# Patient Record
Sex: Male | Born: 1990 | Race: Black or African American | Hispanic: No | Marital: Single | Smoking: Never smoker
Health system: Southern US, Community
[De-identification: ages and names within clinical notes are randomized; demographics above are authoritative.]

## PROBLEM LIST (undated history)

## (undated) DIAGNOSIS — J45909 Unspecified asthma, uncomplicated: Secondary | ICD-10-CM

## (undated) HISTORY — DX: Unspecified asthma, uncomplicated: J45.909

---
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
## 2009-04-04 ENCOUNTER — Emergency Department (HOSPITAL_COMMUNITY): Admission: EM | Admit: 2009-04-04 | Discharge: 2009-04-05 | Payer: Self-pay | Admitting: Emergency Medicine

## 2010-03-10 IMAGING — CR DG CHEST 2V
2 series · 2 of 2 positions shown · non-contrast
Comparison: Chest radiograph performed 06/02/2007

CLINICAL DATA: Fever, cough and shortness of breath for 2 days.

CHEST - 2 VIEW

[w chest pa]
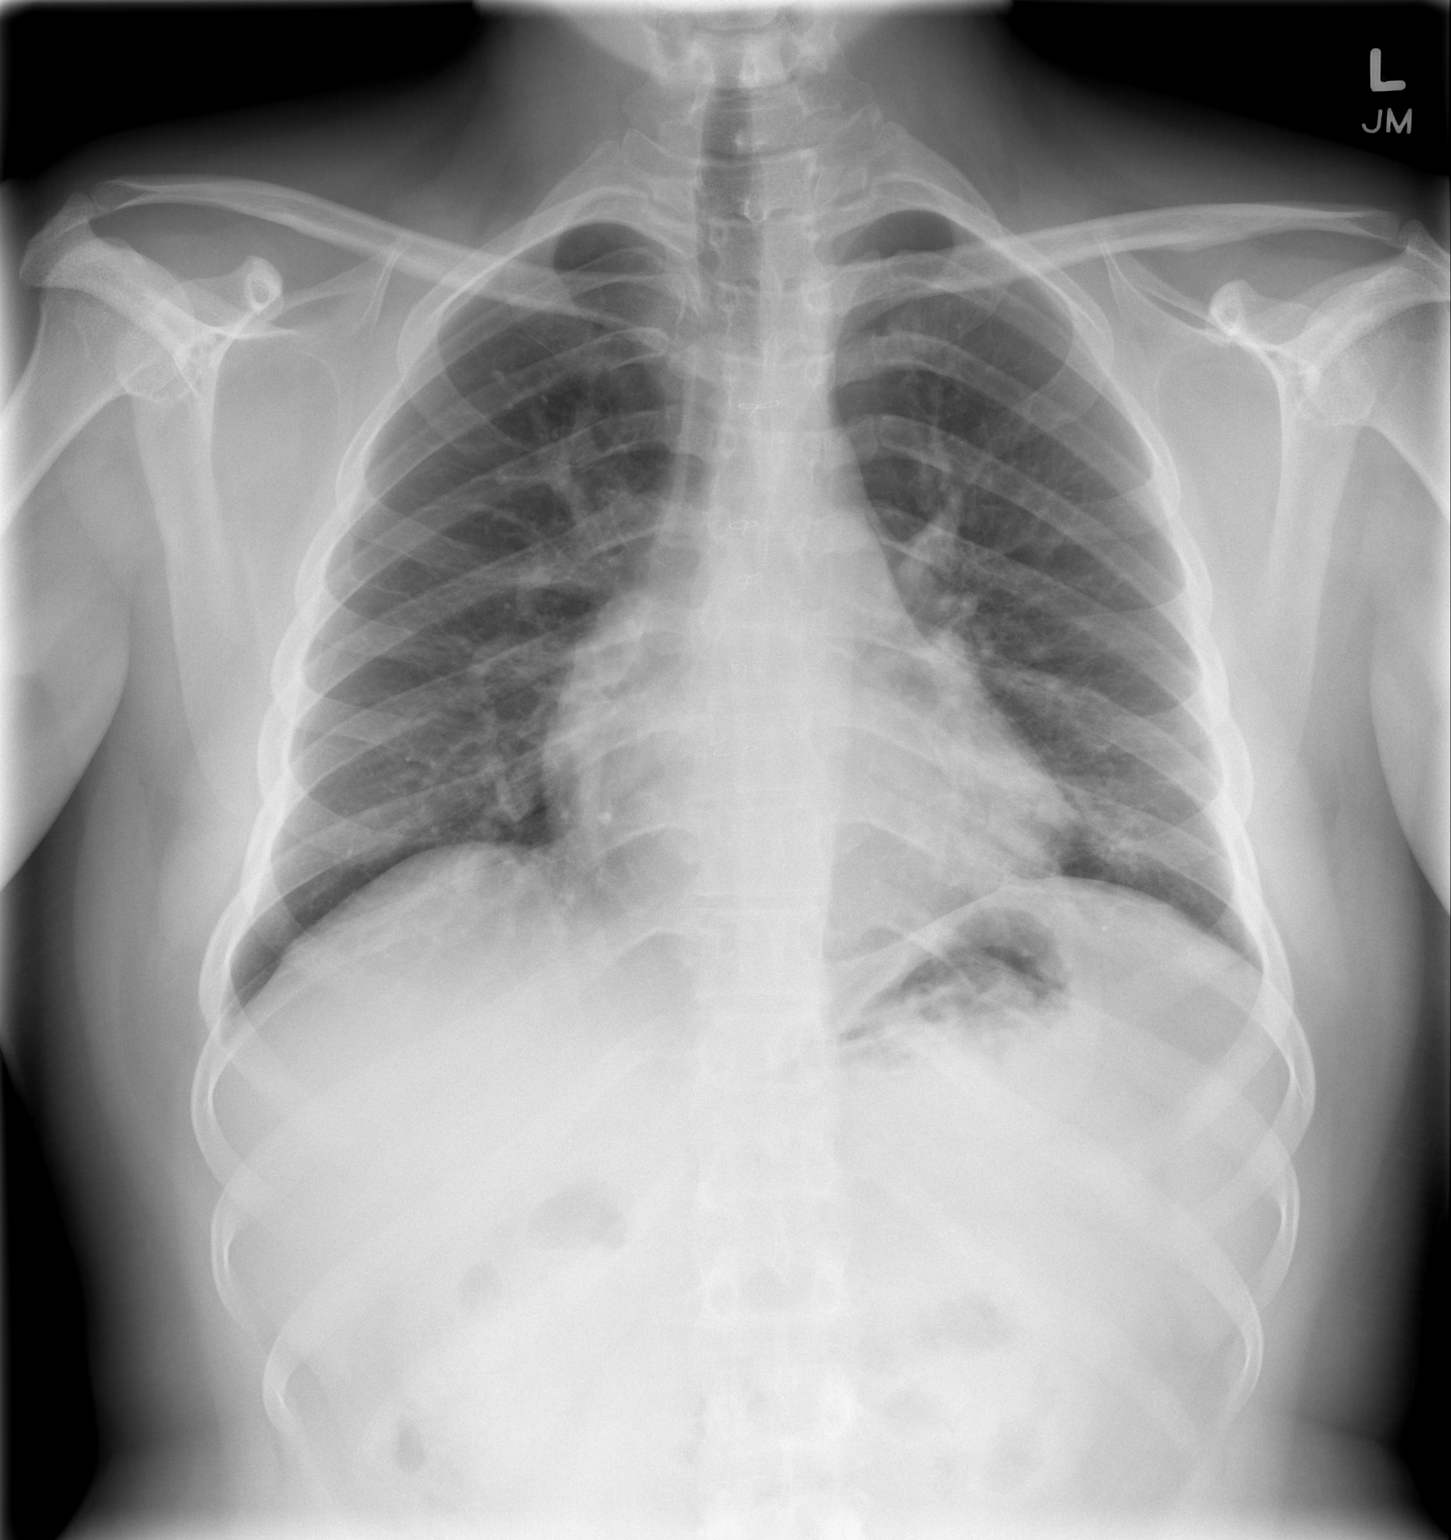

[w chest lat]
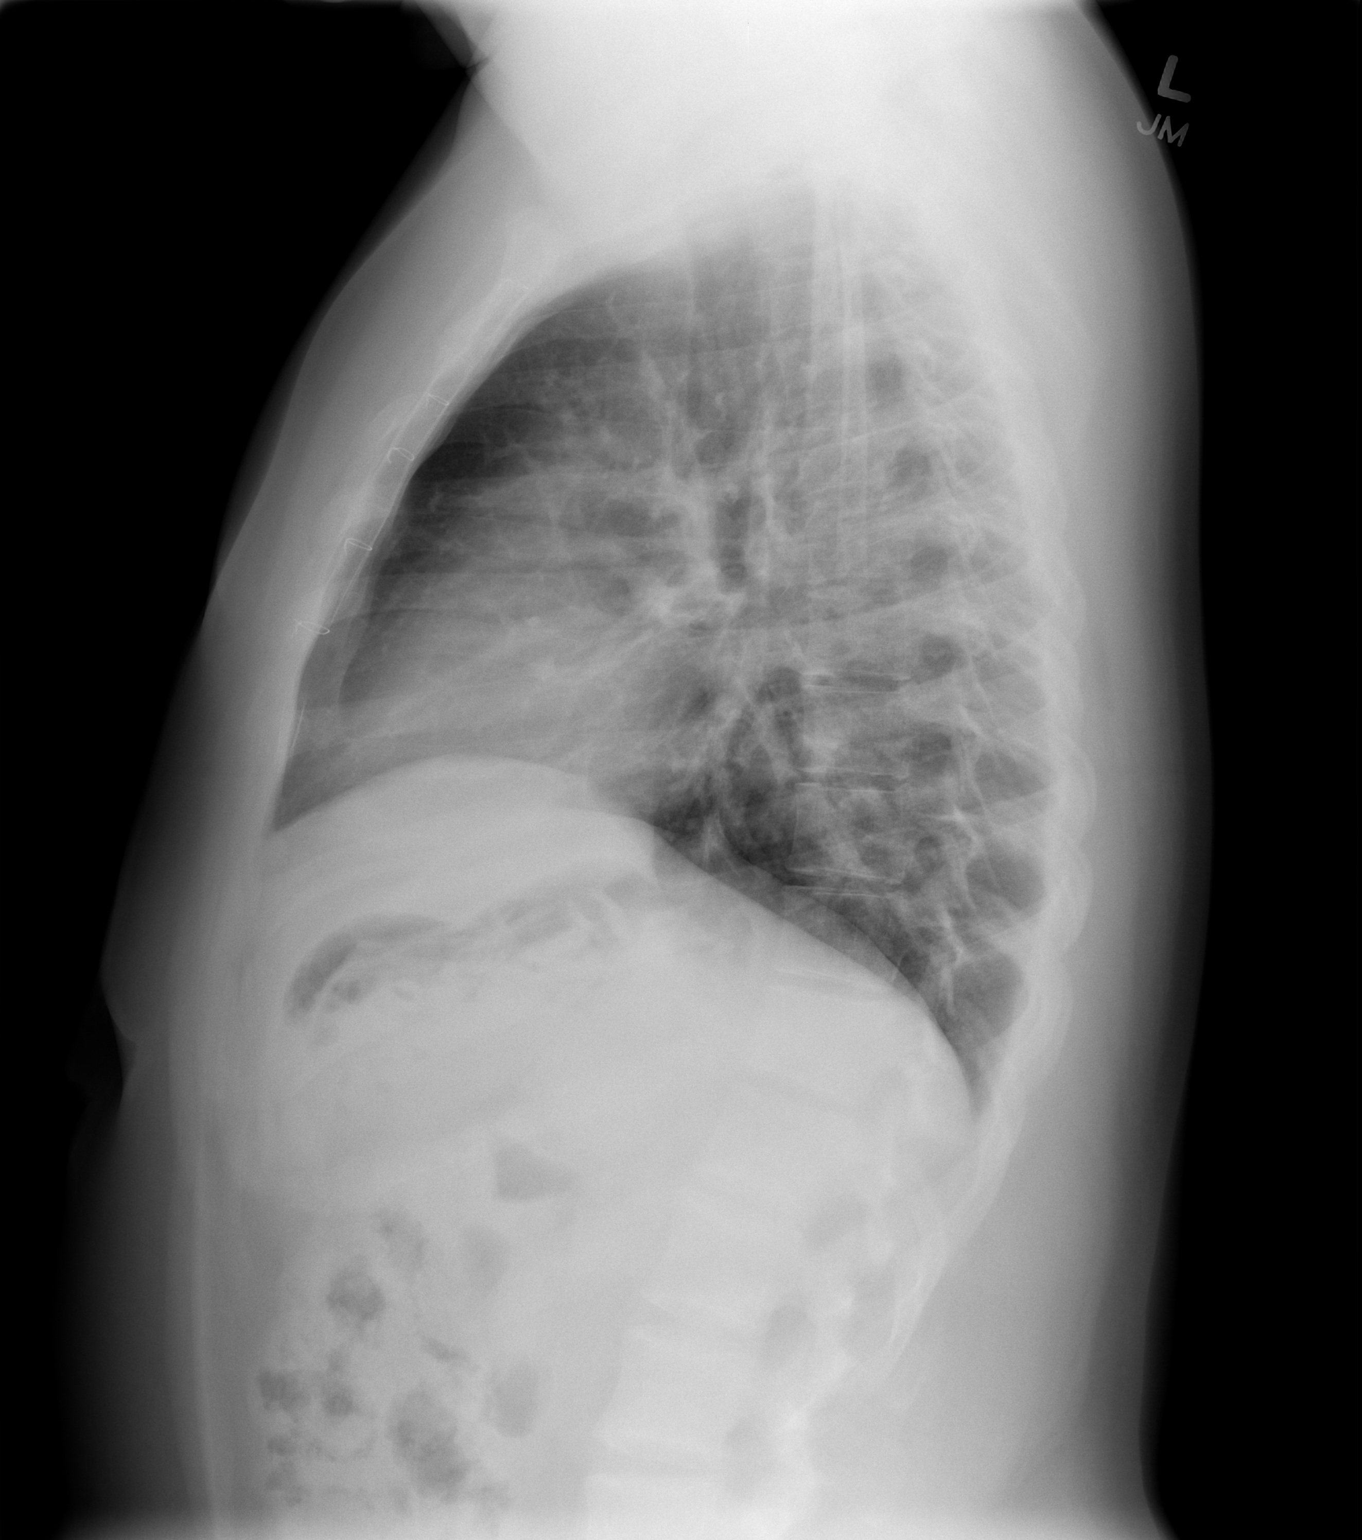

[2 of 2 positions shown; findings below may reference images not displayed]

FINDINGS: Although focal density at the left lung base may reflect
a confluence of ribs and overlying nipple shadow, a small focal
underlying airspace opacity cannot be excluded.  The right lung
remains essentially clear.  No pleural effusion or pneumothorax is
identified.

The heart is stable in size.  No acute osseous abnormalities are
seen.
IMPRESSION: Focal density at the left lung base may reflect a confluence of
ribs and overlying nipple shadow, but a small focal left lower lobe
pneumonia cannot be excluded.

## 2011-03-27 LAB — INFLUENZA A+B VIRUS AG-DIRECT(RAPID): Influenza B Ag: NEGATIVE

## 2015-03-01 ENCOUNTER — Ambulatory Visit (INDEPENDENT_AMBULATORY_CARE_PROVIDER_SITE_OTHER): Payer: 59 | Admitting: Internal Medicine

## 2015-03-01 VITALS — BP 138/90 | HR 86 | Temp 98.2°F | Resp 16 | Ht 65.0 in | Wt 223.4 lb

## 2015-03-01 DIAGNOSIS — R0602 Shortness of breath: Secondary | ICD-10-CM | POA: Diagnosis not present

## 2015-03-01 DIAGNOSIS — J3081 Allergic rhinitis due to animal (cat) (dog) hair and dander: Secondary | ICD-10-CM

## 2015-03-01 DIAGNOSIS — R062 Wheezing: Secondary | ICD-10-CM | POA: Diagnosis not present

## 2015-03-01 MED ORDER — PREDNISONE 20 MG PO TABS: ORAL_TABLET | ORAL | Status: AC

## 2015-03-01 NOTE — Progress Notes (Signed)
   Subjective:    Patient ID: Lucita Ferrara, male    DOB: [birth year], 24 y.o.   MRN: [id number] This chart was scribed for Ellamae Sia, MD by Jolene Provost, Medical Scribe. This patient was seen in Room 11 and the patient's care was started a 5:02 PM.  Chief Complaint  Patient presents with  . Shortness of Breath    x yesterday    HPI HPI Comments: Kristian Mogg Papin is a 24 y.o. male who presents to [facility] complaining of SOB since yesterday. He states he came home from work and there was a new cat at friend's home and immediately felt itchyness in his throat and eyes followed by SOB. He has a past hx of asthma. No meds needed for yrs. Also has runny nose.   Review of Systems [place]    Objective:   Physical Exam  Constitutional: He is oriented to person, place, and time. He appears well-developed and well-nourished. No distress.  HENT:  Head: Normocephalic and atraumatic.  Conjunctiva injected. Turbinates swollen.   Eyes: Pupils are equal, round, and reactive to light.  Neck: Neck supple.  Cardiovascular: Normal rate.   Pulmonary/Chest: Effort normal. No respiratory distress.  Minimal wheezes w/ F.Expir  Musculoskeletal: Normal range of motion.  Neurological: He is alert and oriented to person, place, and time. Coordination normal.  Skin: Skin is warm and dry. He is not diaphoretic.  Psychiatric: He has a normal mood and affect. His behavior is normal.  Nursing note and vitals reviewed.   Filed Vitals:   03/01/15 1643  BP: 138/90  Pulse: 86  Temp: 98.2 F (36.8 C)  TempSrc: Oral  Resp: 16  Height:  (1.651 m)  Weight: 223 lb 6.4 oz (101.334 kg)  SpO2: 97%   Neb albut=wheez cleared tho he notes no impr     Assessment & Plan:  SOB (shortness of breath)  Allergic rhinitis due to animal hair and dander  Wheezing  Meds ordered this encounter  Medications  . predniSONE (DELTASONE) 20 MG tablet    Sig: 3/3/2/2/1/1 single daily dose for 6 days    Dispense:   12 tablet    Refill:  0   Zyrtec daily for 10-14 d Avoid cat  I have completed the patient encounter in its entirety as documented by the scribe, with editing by me where necessary. Destynee Stringfellow P. Merla Riches, M.D.
